# Patient Record
Sex: Female | Born: 1960 | Race: White | Hispanic: No | Marital: Married | Smoking: Never smoker
Health system: Southern US, Community
[De-identification: ages and names within clinical notes are randomized; demographics above are authoritative.]

## PROBLEM LIST (undated history)

## (undated) DIAGNOSIS — E7800 Pure hypercholesterolemia, unspecified: Secondary | ICD-10-CM

## (undated) DIAGNOSIS — E079 Disorder of thyroid, unspecified: Secondary | ICD-10-CM

## (undated) HISTORY — PX: ABDOMINAL HYSTERECTOMY: SHX81

---

## 1996-09-28 HISTORY — PX: REDUCTION MAMMAPLASTY: SUR839

## 2016-04-10 DIAGNOSIS — R12 Heartburn: Secondary | ICD-10-CM | POA: Insufficient documentation

## 2016-04-10 DIAGNOSIS — E782 Mixed hyperlipidemia: Secondary | ICD-10-CM | POA: Insufficient documentation

## 2016-04-10 DIAGNOSIS — Z8639 Personal history of other endocrine, nutritional and metabolic disease: Secondary | ICD-10-CM | POA: Insufficient documentation

## 2016-04-10 DIAGNOSIS — E890 Postprocedural hypothyroidism: Secondary | ICD-10-CM | POA: Insufficient documentation

## 2017-06-17 DIAGNOSIS — N951 Menopausal and female climacteric states: Secondary | ICD-10-CM | POA: Insufficient documentation

## 2018-06-30 DIAGNOSIS — E663 Overweight: Secondary | ICD-10-CM | POA: Insufficient documentation

## 2018-06-30 DIAGNOSIS — E782 Mixed hyperlipidemia: Secondary | ICD-10-CM | POA: Diagnosis not present

## 2018-06-30 DIAGNOSIS — R12 Heartburn: Secondary | ICD-10-CM | POA: Diagnosis not present

## 2018-06-30 DIAGNOSIS — E890 Postprocedural hypothyroidism: Secondary | ICD-10-CM | POA: Diagnosis not present

## 2018-07-07 ENCOUNTER — Other Ambulatory Visit: Payer: Self-pay | Admitting: Nurse Practitioner

## 2018-07-07 DIAGNOSIS — Z1231 Encounter for screening mammogram for malignant neoplasm of breast: Secondary | ICD-10-CM

## 2018-07-28 ENCOUNTER — Ambulatory Visit
Admission: RE | Admit: 2018-07-28 | Discharge: 2018-07-28 | Disposition: A | Discharge status: Home / Self Care | Payer: 59 | Source: Ambulatory Visit | Attending: Nurse Practitioner | Admitting: Nurse Practitioner

## 2018-07-28 DIAGNOSIS — Z1231 Encounter for screening mammogram for malignant neoplasm of breast: Secondary | ICD-10-CM | POA: Diagnosis not present

## 2018-10-23 ENCOUNTER — Ambulatory Visit (INDEPENDENT_AMBULATORY_CARE_PROVIDER_SITE_OTHER): Payer: 59

## 2018-10-23 ENCOUNTER — Ambulatory Visit
Admission: EM | Admit: 2018-10-23 | Discharge: 2018-10-23 | Disposition: A | Discharge status: Home / Self Care | Payer: 59 | Attending: Family Medicine | Admitting: Family Medicine

## 2018-10-23 ENCOUNTER — Other Ambulatory Visit: Payer: Self-pay

## 2018-10-23 DIAGNOSIS — R0781 Pleurodynia: Secondary | ICD-10-CM

## 2018-10-23 DIAGNOSIS — S299XXA Unspecified injury of thorax, initial encounter: Secondary | ICD-10-CM | POA: Diagnosis not present

## 2018-10-23 HISTORY — DX: Pure hypercholesterolemia, unspecified: E78.00

## 2018-10-23 HISTORY — DX: Disorder of thyroid, unspecified: E07.9

## 2018-10-23 MED ORDER — TRAMADOL HCL 50 MG PO TABS: 50.0000 mg | ORAL_TABLET | Freq: Three times a day (TID) | ORAL | 0 refills | Status: DC | PRN

## 2018-10-23 MED ORDER — MELOXICAM 15 MG PO TABS: 15.0000 mg | ORAL_TABLET | Freq: Every day | ORAL | 0 refills | Status: DC | PRN

## 2018-10-23 MED ORDER — TIZANIDINE HCL 4 MG PO CAPS: 4.0000 mg | ORAL_CAPSULE | Freq: Three times a day (TID) | ORAL | 0 refills | Status: DC | PRN

## 2018-10-23 NOTE — ED Provider Notes (Signed)
[facility]    CSN: [id number] Arrival date & time: 10/23/18  1211  History   Chief Complaint Chief Complaint  Patient presents with  . Muscle Pain  . Appointment   HPI  58 year old female presents with the above complaints.  Patient reports that last Monday she was at [facility].  She bent over and leaned down into a freezer/cooler.  In doing so she injured her right lower ribs.  She states that she continues to have pain.  Severe.  8/10 in severity.  Worse with certain movements.  Worse with deep breathing.  She has been taking ibuprofen without resolution.  It does help some.  No other associated symptoms.  No other complaints.  PMH, Surgical Hx, Family Hx, Social History reviewed and updated as below.  Past Medical History:  Diagnosis Date  . High cholesterol   . Thyroid disease   GERD  Past Surgical History:  Procedure Laterality Date  . ABDOMINAL HYSTERECTOMY    . REDUCTION MAMMAPLASTY Bilateral 1998   OB History   No obstetric history on file.    Home Medications    Prior to Admission medications   Medication Sig Start Date End Date Taking? Authorizing Provider  atorvastatin (LIPITOR) 20 MG tablet Take by mouth. 12/31/16  Yes [provider]  levothyroxine (SYNTHROID, LEVOTHROID) 100 MCG tablet TAKE 1 TABLET BY MOUTH DAILY AT 6 12/31/16  Yes [provider]  Calcium Carbonate-Vitamin D (CALCIUM HIGH POTENCY/VITAMIN D) 600-200 MG-UNIT TABS Take by mouth.    [provider]  meloxicam (MOBIC) 15 MG tablet Take 1 tablet (15 mg total) by mouth daily as needed. 10/23/18   Coral Spikes, DO  Multiple Vitamin (MULTIVITAMIN) tablet Take by mouth.    [provider]  tiZANidine (ZANAFLEX) 4 MG capsule Take 1 capsule (4 mg total) by mouth 3 (three) times daily as needed for muscle spasms. 10/23/18   Coral Spikes, DO  traMADol (ULTRAM) 50 MG tablet Take 1 tablet (50 mg total) by mouth every 8 (eight) hours as needed for moderate pain  or severe pain. 10/23/18   Coral Spikes, DO   Family History Family History  Problem Relation Age of Onset  . Breast cancer Sister 66       1/2 sister   Social History Social History   Tobacco Use  . Smoking status: Never Smoker  . Smokeless tobacco: Never Used  Substance Use Topics  . Alcohol use: Not Currently  . Drug use: Never   Allergies   Patient has no known allergies.  Review of Systems Review of Systems  Constitutional: Negative.   Musculoskeletal:       Right lower rib pain.   Physical Exam Triage Vital Signs ED Triage Vitals  Enc Vitals Group     BP 10/23/18 1223 139/80     Pulse Rate 10/23/18 1223 83     Resp 10/23/18 1223 16     Temp 10/23/18 1223 97.7 F (36.5 C)     Temp Source 10/23/18 1223 Oral     SpO2 10/23/18 1223 100 %     Weight 10/23/18 1225 173 lb (78.5 kg)     Height 10/23/18 1225 5\' 4"  (1.626 m)     Head Circumference --      Peak Flow --      Pain Score 10/23/18 1225 8     Pain Loc --      Pain Edu? --      Excl. in [place]? --  Updated Vital Signs BP 139/80 (BP Location: Left Arm)   Pulse 83   Temp 97.7 F (36.5 C) (Oral)   Resp 16   Ht 5\' 4"  (1.626 m)   Wt 78.5 kg   SpO2 100%   BMI 29.70 kg/m   Visual Acuity Right Eye Distance:   Left Eye Distance:   Bilateral Distance:    Right Eye Near:   Left Eye Near:    Bilateral Near:     Physical Exam Vitals signs and nursing note reviewed.  Constitutional:      General: She is not in acute distress. HENT:     Head: Normocephalic and atraumatic.  Eyes:     General:        Right eye: No discharge.        Left eye: No discharge.     Conjunctiva/sclera: Conjunctivae normal.  Cardiovascular:     Rate and Rhythm: Normal rate and regular rhythm.  Pulmonary:     Effort: Pulmonary effort is normal.     Breath sounds: Normal breath sounds.  Musculoskeletal:     Comments: Patient with a discrete area of tenderness on the right flank/right lower ribs.  No apparent bruising.    Neurological:     Mental Status: She is alert.  Psychiatric:        Mood and Affect: Mood normal.        Behavior: Behavior normal.    UC Treatments / Results  Labs (all labs ordered are listed, but only abnormal results are displayed) Labs Reviewed - No data to display  EKG None  Radiology Dg Ribs Unilateral W/chest Right  Result Date: 10/23/2018 CLINICAL DATA:  Lower right-sided rib pain after hitting right side 1 week ago. EXAM: RIGHT RIBS AND CHEST - 3+ VIEW COMPARISON:  None. FINDINGS: No fracture or other bone lesions are seen involving the ribs. There is no evidence of pneumothorax or pleural effusion. Both lungs are clear. Heart size and mediastinal contours are within normal limits. IMPRESSION: Negative. Electronically Signed   By: Kerby Moors M.D.   On: 10/23/2018 13:11    Procedures Procedures (including critical care time)  Medications Ordered in UC Medications - No data to display  Initial Impression / Assessment and Plan / UC Course  I have reviewed the triage vital signs and the nursing notes.  Pertinent labs & imaging results that were available during my care of the patient were reviewed by me and considered in my medical decision making (see chart for details).    58 year old female presents with rib pain/intercostal muscle pain/strain.  Treated with meloxicam, Zanaflex, tramadol.  Final Clinical Impressions(s) / UC Diagnoses   Final diagnoses:  Rib pain     Discharge Instructions     Rest.  Heat.  Medications as prescribed.  Take care  Dr. Lacinda Axon    ED Prescriptions    Medication Sig Dispense Auth. Provider   meloxicam (MOBIC) 15 MG tablet Take 1 tablet (15 mg total) by mouth daily as needed. 30 tablet Shanena Pellegrino G, DO   tiZANidine (ZANAFLEX) 4 MG capsule Take 1 capsule (4 mg total) by mouth 3 (three) times daily as needed for muscle spasms. 30 capsule Zalia Hautala G, DO   traMADol (ULTRAM) 50 MG tablet Take 1 tablet (50 mg total) by  mouth every 8 (eight) hours as needed for moderate pain or severe pain. 15 tablet Coral Spikes, DO     Controlled Substance Prescriptions [place] Controlled Substance Registry consulted? Not  Applicable   Coral Spikes, DO 10/23/18 1711

## 2018-10-23 NOTE — Discharge Instructions (Signed)
Rest.  Heat.  Medications as prescribed.  Take care  Dr. Janelli Welling  

## 2018-10-23 NOTE — ED Triage Notes (Signed)
Pt was leaning over a freezer last week and pulled something in her right side. Has been having right rib or muscle pain. Pain 8/10. Lying flat makes it better, and certain movements makes it worse. Worse with deep breath

## 2019-05-09 ENCOUNTER — Encounter: Payer: Self-pay | Admitting: Family Medicine

## 2019-05-09 ENCOUNTER — Other Ambulatory Visit: Payer: Self-pay

## 2019-05-09 ENCOUNTER — Ambulatory Visit: Payer: 59 | Admitting: Family Medicine

## 2019-05-09 VITALS — BP 118/70 | HR 92 | Temp 97.8°F | Resp 16 | Ht 63.5 in | Wt 171.0 lb

## 2019-05-09 DIAGNOSIS — E782 Mixed hyperlipidemia: Secondary | ICD-10-CM

## 2019-05-09 DIAGNOSIS — E663 Overweight: Secondary | ICD-10-CM | POA: Diagnosis not present

## 2019-05-09 DIAGNOSIS — E890 Postprocedural hypothyroidism: Secondary | ICD-10-CM

## 2019-05-09 DIAGNOSIS — Z8639 Personal history of other endocrine, nutritional and metabolic disease: Secondary | ICD-10-CM | POA: Diagnosis not present

## 2019-05-09 DIAGNOSIS — R12 Heartburn: Secondary | ICD-10-CM | POA: Diagnosis not present

## 2019-05-09 DIAGNOSIS — Z85820 Personal history of malignant melanoma of skin: Secondary | ICD-10-CM | POA: Diagnosis not present

## 2019-05-09 NOTE — Progress Notes (Signed)
Name: Caitlyn [name]   MRN: [id number]    DOB: 1961-05-07   Date:05/09/2019       Progress Note  Subjective  Chief Complaint  Chief Complaint  Patient presents with  . Establish Care  . Hypothyroidism  . 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  No hx thyroid cancer.  She has been on 159mcg for many years.  Denies constipation, diarrhea (takes a lot of fiber), no hair/skin/nail changes, no palpitations, heat/cold intolerance.   HLD: Has ben taking atorvastatin 20mg  for years, doing well with no chest pain, shortness of breath, or myalgias.   Overweight: Eats plenty of vegetables, tried to decrease carbohydrates, minimal fried foods/salt.  Grills her food a lot. Walks on treadmill 46min 5 days a week. She is down 9lbs since her last visit with prior PCP.   Heart Burn: Depends on her diet - has to avoid spicy/red sauce.  No difficulty swallowing, abdominal pain, blood in stool or dark and tarry stool.  Hx Melanoma and Vitamin D Def:  Going to dermatology annually; when she was diagnosed with melanoma she was told to stay out of the sun, so she started having lower vitamin D levels around that time.    HM: Hx total hysterectomy - has not been having paps done since then - about 10 years ago. Declines HIV screen; Hep C negative in 2017. Colonoscopy - due in 1 year - had one done in 2016  There are no active problems to display for this patient.   Past Surgical History:  Procedure Laterality Date  . ABDOMINAL HYSTERECTOMY    . REDUCTION MAMMAPLASTY Bilateral 1998    Family History  Problem Relation Age of Onset  . Breast cancer Sister 3       1/2 sister    Social History   Socioeconomic History  . Marital status: Married    Spouse name: Patrick Jupiter  . Number of children: 0  . Years of education: Not on file  . Highest  education level: Not on file  Occupational History  . Not on file  Social Needs  . Financial resource strain: Not hard at all  . Food insecurity    Worry: Never true    Inability: Never true  . Transportation needs    Medical: No    Non-medical: No  Tobacco Use  . Smoking status: Never Smoker  . Smokeless tobacco: Never Used  Substance and Sexual Activity  . Alcohol use: Not Currently  . Drug use: Never  . Sexual activity: Yes    Partners: Male  Lifestyle  . Physical activity    Days per week: 5 days    Minutes per session: 20 min  . Stress: Only a little  Relationships  . Social connections    Talks on phone: More than three times a week    Gets together: Never    Attends religious service: More than 4 times per year    Active member of club or organization: Yes    Attends meetings of clubs or organizations: More than 4 times per year    Relationship status: Married  . Intimate partner violence    Fear of current or ex partner: No    Emotionally abused: No    Physically abused: No    Forced sexual activity: No  Other Topics Concern  .  Not on file  Social History Narrative   [facility]     Current Outpatient Medications:  .  atorvastatin (LIPITOR) 20 MG tablet, Take by mouth., Disp: , Rfl:  .  Calcium Carbonate-Vitamin D (CALCIUM HIGH POTENCY/VITAMIN D) 600-200 MG-UNIT TABS, Take by mouth., Disp: , Rfl:  .  levothyroxine (SYNTHROID, LEVOTHROID) 100 MCG tablet, TAKE 1 TABLET BY MOUTH DAILY AT 6, Disp: , Rfl:  .  Multiple Vitamin (MULTIVITAMIN) tablet, Take by mouth., Disp: , Rfl:  .  meloxicam (MOBIC) 15 MG tablet, Take 1 tablet (15 mg total) by mouth daily as needed. (Patient not taking: Reported on 05/09/2019), Disp: 30 tablet, Rfl: 0 .  tiZANidine (ZANAFLEX) 4 MG capsule, Take 1 capsule (4 mg total) by mouth 3 (three) times daily as needed for muscle spasms. (Patient not taking: Reported on 05/09/2019), Disp: 30 capsule, Rfl: 0 .  traMADol  (ULTRAM) 50 MG tablet, Take 1 tablet (50 mg total) by mouth every 8 (eight) hours as needed for moderate pain or severe pain. (Patient not taking: Reported on 05/09/2019), Disp: 15 tablet, Rfl: 0  No Known Allergies  I personally reviewed active problem list, medication list, allergies, notes from last encounter, lab results with the patient/caregiver today.   ROS  Constitutional: Negative for fever or weight change.  Respiratory: Negative for cough and shortness of breath.   Cardiovascular: Negative for chest pain or palpitations.  Gastrointestinal: Negative for abdominal pain, no bowel changes.  Musculoskeletal: Negative for gait problem or joint swelling.  Skin: Negative for rash.  Neurological: Negative for dizziness or headache.  No other specific complaints in a complete review of systems (except as listed in HPI above).  Objective  Vitals:   05/09/19 1055  BP: 118/70  Pulse: 92  Resp: 16  Temp: 97.8 F (36.6 C)  TempSrc: Oral  SpO2: 99%  Weight: 171 lb (77.6 kg)  Height: 5' 3.5" (1.613 m)    Body mass index is 29.82 kg/m.  Physical Exam  Constitutional: Patient appears well-developed and well-nourished. No distress.  HENT: Head: Normocephalic and atraumatic. Eyes: Conjunctivae and EOM are normal. No scleral icterus.  Neck: Normal range of motion. Neck supple. No JVD present. No thyromegaly present.  Cardiovascular: Normal rate, regular rhythm and normal heart sounds.  No murmur heard. No BLE edema. Pulmonary/Chest: Effort normal and breath sounds normal. No respiratory distress. Musculoskeletal: Normal range of motion, no joint effusions. No gross deformities Neurological: Pt is alert and oriented to person, place, and time. No cranial nerve deficit. Coordination, balance, strength, speech and gait are normal.  Skin: Skin is warm and dry. No rash noted. No erythema.  Psychiatric: Patient has a normal mood and affect. behavior is normal. Judgment and thought  content normal.  No results found for this or any previous visit (from the past 72 hour(s)).   PHQ2/9: Depression screen PHQ 2/9 05/09/2019  Decreased Interest 0  Down, Depressed, Hopeless 0  PHQ - 2 Score 0  Altered sleeping 0  Tired, decreased energy 0  Change in appetite 0  Feeling bad or failure about yourself  0  Trouble concentrating 0  Moving slowly or fidgety/restless 0  Suicidal thoughts 0  PHQ-9 Score 0  Difficult doing work/chores Not difficult at all   PHQ-2/9 Result is negative.    Fall Risk: Fall Risk  05/09/2019  Falls in the past year? 0  Number falls in past yr: 0  Injury with Fall? 0  Follow up Falls evaluation completed  Assessment & Plan  1. Postoperative hypothyroidism - Continue with current regimen - TSH  2. Mixed hyperlipidemia - Continue statin therapy - Lipid panel  3. Overweight (BMI 25.0-29.9) - Discussed importance of 150 minutes of physical activity weekly, eat two servings of fish weekly, eat one serving of tree nuts ( cashews, pistachios, pecans, almonds.Marland Kitchen) every other day, eat 6 servings of fruit/vegetables daily and drink plenty of water and avoid sweet beverages.  - Comprehensive metabolic panel  4. Heartburn - Avoid triggers - Comprehensive metabolic panel  5. History of vitamin D deficiency - VITAMIN D 25 Hydroxy (Vit-D Deficiency, Fractures)  6. History of melanoma - Follow up with dermatology annually.

## 2019-05-10 ENCOUNTER — Other Ambulatory Visit: Payer: Self-pay | Admitting: Family Medicine

## 2019-05-10 DIAGNOSIS — E875 Hyperkalemia: Secondary | ICD-10-CM

## 2019-05-10 DIAGNOSIS — E890 Postprocedural hypothyroidism: Secondary | ICD-10-CM

## 2019-05-10 LAB — LIPID PANEL
Cholesterol: 150 mg/dL (ref <200)
HDL: 47 mg/dL — ABNORMAL LOW (ref >=50)
LDL Cholesterol (Calc): 78 mg/dL (calc)
Non-HDL Cholesterol (Calc): 103 mg/dL (calc) (ref <130)
Total CHOL/HDL Ratio: 3.2 (calc) (ref <5.0)
Triglycerides: 148 mg/dL (ref <150)

## 2019-05-10 LAB — COMPREHENSIVE METABOLIC PANEL
AG Ratio: 1.7 (calc) (ref 1.0–2.5)
ALT: 25 U/L (ref 6–29)
AST: 22 U/L (ref 10–35)
Albumin: 4.7 g/dL (ref 3.6–5.1)
Alkaline phosphatase (APISO): 58 U/L (ref 37–153)
BUN: 9 mg/dL (ref 7–25)
CO2: 28 mmol/L (ref 20–32)
Calcium: 9.7 mg/dL (ref 8.6–10.4)
Chloride: 104 mmol/L (ref 98–110)
Creat: 0.7 mg/dL (ref 0.50–1.05)
Globulin: 2.7 g/dL (calc) (ref 1.9–3.7)
Glucose, Bld: 94 mg/dL (ref 65–99)
Potassium: 5.4 mmol/L — ABNORMAL HIGH (ref 3.5–5.3)
Sodium: 141 mmol/L (ref 135–146)
Total Bilirubin: 0.7 mg/dL (ref 0.2–1.2)
Total Protein: 7.4 g/dL (ref 6.1–8.1)

## 2019-05-10 LAB — TSH: TSH: 0.31 mIU/L — ABNORMAL LOW

## 2019-05-10 LAB — VITAMIN D 25 HYDROXY (VIT D DEFICIENCY, FRACTURES): Vit D, 25-Hydroxy: 38 ng/mL (ref 30–100)

## 2019-06-14 ENCOUNTER — Other Ambulatory Visit: Payer: Self-pay | Admitting: Family Medicine

## 2019-06-14 DIAGNOSIS — Z1231 Encounter for screening mammogram for malignant neoplasm of breast: Secondary | ICD-10-CM

## 2019-06-20 ENCOUNTER — Telehealth: Payer: Self-pay | Admitting: Family Medicine

## 2019-06-20 NOTE — Telephone Encounter (Signed)
-----   Message from Hubbard Hartshorn, [place] sent at 05/10/2019  1:23 PM EDT ----- Regarding: Call patient to come in for repeat labs Call patient to come in for repeat labs

## 2019-06-23 DIAGNOSIS — D2262 Melanocytic nevi of left upper limb, including shoulder: Secondary | ICD-10-CM | POA: Diagnosis not present

## 2019-06-23 DIAGNOSIS — D225 Melanocytic nevi of trunk: Secondary | ICD-10-CM | POA: Diagnosis not present

## 2019-06-23 DIAGNOSIS — L570 Actinic keratosis: Secondary | ICD-10-CM | POA: Diagnosis not present

## 2019-06-23 DIAGNOSIS — X32XXXA Exposure to sunlight, initial encounter: Secondary | ICD-10-CM | POA: Diagnosis not present

## 2019-06-23 DIAGNOSIS — Z85820 Personal history of malignant melanoma of skin: Secondary | ICD-10-CM | POA: Diagnosis not present

## 2019-06-23 DIAGNOSIS — L821 Other seborrheic keratosis: Secondary | ICD-10-CM | POA: Diagnosis not present

## 2019-06-23 DIAGNOSIS — D485 Neoplasm of uncertain behavior of skin: Secondary | ICD-10-CM | POA: Diagnosis not present

## 2019-06-23 DIAGNOSIS — D2261 Melanocytic nevi of right upper limb, including shoulder: Secondary | ICD-10-CM | POA: Diagnosis not present

## 2019-06-23 DIAGNOSIS — Z08 Encounter for follow-up examination after completed treatment for malignant neoplasm: Secondary | ICD-10-CM | POA: Diagnosis not present

## 2019-06-23 DIAGNOSIS — D2271 Melanocytic nevi of right lower limb, including hip: Secondary | ICD-10-CM | POA: Diagnosis not present

## 2019-06-23 DIAGNOSIS — D2272 Melanocytic nevi of left lower limb, including hip: Secondary | ICD-10-CM | POA: Diagnosis not present

## 2019-06-27 ENCOUNTER — Other Ambulatory Visit: Payer: Self-pay

## 2019-06-27 DIAGNOSIS — E875 Hyperkalemia: Secondary | ICD-10-CM

## 2019-06-27 DIAGNOSIS — E890 Postprocedural hypothyroidism: Secondary | ICD-10-CM

## 2019-06-27 NOTE — Telephone Encounter (Signed)
Patient states she did not realize she had to come back for labs. She thought someone told her they were normal. Caitlyn [name] states she does not take any Potassium supplements nor eat a diet rich in Potassium foods. I forwarded her labs results on Mychart message and asked her to come back to recheck her levels. Also she states she has been maintaining taking her Levothyroxine 100 mcg daily.

## 2019-06-28 NOTE — Telephone Encounter (Signed)
Documentation reviewed 

## 2019-07-05 DIAGNOSIS — E890 Postprocedural hypothyroidism: Secondary | ICD-10-CM | POA: Diagnosis not present

## 2019-07-05 DIAGNOSIS — E875 Hyperkalemia: Secondary | ICD-10-CM | POA: Diagnosis not present

## 2019-07-06 LAB — BASIC METABOLIC PANEL WITH GFR
BUN: 11 mg/dL (ref 7–25)
CO2: 32 mmol/L (ref 20–32)
Calcium: 9.6 mg/dL (ref 8.6–10.4)
Chloride: 103 mmol/L (ref 98–110)
Creat: 0.72 mg/dL (ref 0.50–1.05)
GFR, Est African American: 108 mL/min/{1.73_m2} (ref >=60)
GFR, Est Non African American: 94 mL/min/{1.73_m2} (ref >=60)
Glucose, Bld: 92 mg/dL (ref 65–99)
Potassium: 4.8 mmol/L (ref 3.5–5.3)
Sodium: 140 mmol/L (ref 135–146)

## 2019-07-06 LAB — TSH: TSH: 0.29 mIU/L — ABNORMAL LOW (ref 0.40–4.50)

## 2019-07-13 ENCOUNTER — Telehealth: Payer: Self-pay | Admitting: Family Medicine

## 2019-07-13 ENCOUNTER — Other Ambulatory Visit: Payer: Self-pay | Admitting: Emergency Medicine

## 2019-07-13 DIAGNOSIS — E890 Postprocedural hypothyroidism: Secondary | ICD-10-CM

## 2019-07-13 MED ORDER — LEVOTHYROXINE SODIUM 100 MCG PO TABS: ORAL_TABLET | ORAL | 0 refills | Status: DC

## 2019-07-13 NOTE — Telephone Encounter (Signed)
Copied from [place] [id number]. Topic: Quick Communication - Rx Refill/Question >> Jul 13, 2019 10:19 AM Rainey Pines A wrote: Medication: levothyroxine (SYNTHROID, LEVOTHROID) 100 MCG tablet ,atorvastatin (LIPITOR) 20 MG tablet  (Patient would like callback from nurse once medication has been sent to pharmacy.  Has the patient contacted their pharmacy? {Yes (Agent: If no, request that the patient contact the pharmacy for the refill.) (Agent: If yes, when and what did the pharmacy advise?)Contact PCP  Preferred Pharmacy (with phone number or street name): [facility], [place] [phone] (Phone) [phone] (Fax)    Agent: Please be advised that RX refills may take up to 3 business days. We ask that you follow-up with your pharmacy.

## 2019-07-13 NOTE — Telephone Encounter (Signed)
Order sent to [facility] for refill 

## 2019-07-17 ENCOUNTER — Encounter: Payer: Self-pay | Admitting: Family Medicine

## 2019-07-17 DIAGNOSIS — E890 Postprocedural hypothyroidism: Secondary | ICD-10-CM

## 2019-07-17 MED ORDER — ATORVASTATIN CALCIUM 20 MG PO TABS: 20.0000 mg | ORAL_TABLET | Freq: Every day | ORAL | 1 refills | Status: DC

## 2019-07-17 MED ORDER — LEVOTHYROXINE SODIUM 100 MCG PO TABS: ORAL_TABLET | ORAL | 0 refills | Status: DC

## 2019-07-18 NOTE — Telephone Encounter (Signed)
Pt states rxs were sent to the wrong pharmacy.  Please re send rx to:   [name], Caitlyn [phone] (Phone) [phone] (Fax)   Pt also states that when she called [facility], they did not have rx for her.

## 2019-07-19 ENCOUNTER — Other Ambulatory Visit: Payer: Self-pay | Admitting: Emergency Medicine

## 2019-07-19 DIAGNOSIS — E890 Postprocedural hypothyroidism: Secondary | ICD-10-CM

## 2019-07-19 MED ORDER — ATORVASTATIN CALCIUM 20 MG PO TABS: 20.0000 mg | ORAL_TABLET | Freq: Every day | ORAL | 3 refills | Status: AC

## 2019-07-19 MED ORDER — LEVOTHYROXINE SODIUM 100 MCG PO TABS: ORAL_TABLET | ORAL | 0 refills | Status: DC

## 2019-07-19 NOTE — Addendum Note (Signed)
Addended by: Hubbard Hartshorn on: 07/19/2019 11:35 AM   Modules accepted: Orders

## 2019-07-19 NOTE — Telephone Encounter (Signed)
Please send again, Called to cancel at [place]

## 2019-07-19 NOTE — Telephone Encounter (Signed)
Rx's sent to [facility].

## 2019-07-19 NOTE — Telephone Encounter (Signed)
Script pend in system, pharmacy changed to [facility]

## 2019-07-31 ENCOUNTER — Ambulatory Visit
Admission: RE | Admit: 2019-07-31 | Discharge: 2019-07-31 | Disposition: A | Discharge status: Home / Self Care | Payer: 59 | Source: Ambulatory Visit | Attending: Family Medicine | Admitting: Family Medicine

## 2019-07-31 DIAGNOSIS — Z1231 Encounter for screening mammogram for malignant neoplasm of breast: Secondary | ICD-10-CM | POA: Insufficient documentation

## 2019-08-16 ENCOUNTER — Telehealth: Payer: Self-pay | Admitting: Family Medicine

## 2019-08-16 NOTE — Telephone Encounter (Signed)
Documentation reviewed 

## 2019-08-16 NOTE — Telephone Encounter (Signed)
Patient notified. Mom tested positive for Covid at 40 and she has been in a panic. Has not remembered to take medication in past week. Will come by in about 3 weeks to do labs

## 2019-08-16 NOTE — Telephone Encounter (Signed)
-----   Message from Hubbard Hartshorn, FNP sent at 07/13/2019  5:59 PM EDT ----- Regarding: Come in for TSH recheck Please call the patient to remind her to come in for TSH recheck.

## 2019-08-17 ENCOUNTER — Telehealth: Payer: Self-pay | Admitting: Family Medicine

## 2019-08-17 NOTE — Telephone Encounter (Signed)
Patient stated she had a lot going on and will come for labs and appointment in about 3 weeks

## 2019-08-17 NOTE — Telephone Encounter (Signed)
-----   Message from Hubbard Hartshorn, [place] sent at 07/06/2019  8:05 AM EDT ----- Regarding: TSH recheck Needs to come in for physical and TSH recheck if not already done.

## 2019-09-11 ENCOUNTER — Encounter: Payer: Self-pay | Admitting: Family Medicine

## 2019-09-12 DIAGNOSIS — E890 Postprocedural hypothyroidism: Secondary | ICD-10-CM | POA: Diagnosis not present

## 2019-09-12 LAB — TSH: TSH: 1.24 mIU/L (ref 0.40–4.50)

## 2019-10-13 DIAGNOSIS — H31012 Macula scars of posterior pole (postinflammatory) (post-traumatic), left eye: Secondary | ICD-10-CM | POA: Diagnosis not present

## 2019-10-13 DIAGNOSIS — H04123 Dry eye syndrome of bilateral lacrimal glands: Secondary | ICD-10-CM | POA: Diagnosis not present

## 2019-10-13 DIAGNOSIS — H35362 Drusen (degenerative) of macula, left eye: Secondary | ICD-10-CM | POA: Diagnosis not present

## 2019-10-13 DIAGNOSIS — H5213 Myopia, bilateral: Secondary | ICD-10-CM | POA: Diagnosis not present

## 2019-10-16 ENCOUNTER — Other Ambulatory Visit: Payer: Self-pay | Admitting: Family Medicine

## 2019-10-16 DIAGNOSIS — E890 Postprocedural hypothyroidism: Secondary | ICD-10-CM

## 2019-11-14 DIAGNOSIS — L820 Inflamed seborrheic keratosis: Secondary | ICD-10-CM | POA: Diagnosis not present

## 2019-11-14 DIAGNOSIS — L538 Other specified erythematous conditions: Secondary | ICD-10-CM | POA: Diagnosis not present

## 2019-11-14 DIAGNOSIS — R208 Other disturbances of skin sensation: Secondary | ICD-10-CM | POA: Diagnosis not present

## 2020-03-27 ENCOUNTER — Telehealth: Payer: Self-pay

## 2020-03-27 NOTE — Telephone Encounter (Signed)
Pt needs to schedule an appt for refills

## 2020-03-28 NOTE — Telephone Encounter (Signed)
Pt said she is currently out of town and does not know when she will be back. she will have to go to a urgent care because she did not know she had no refills

## 2020-04-08 DIAGNOSIS — R002 Palpitations: Secondary | ICD-10-CM | POA: Diagnosis not present

## 2020-04-08 DIAGNOSIS — E7800 Pure hypercholesterolemia, unspecified: Secondary | ICD-10-CM | POA: Diagnosis not present

## 2020-04-08 DIAGNOSIS — E039 Hypothyroidism, unspecified: Secondary | ICD-10-CM | POA: Diagnosis not present

## 2020-04-17 DIAGNOSIS — E039 Hypothyroidism, unspecified: Secondary | ICD-10-CM | POA: Diagnosis not present

## 2020-04-17 DIAGNOSIS — R1013 Epigastric pain: Secondary | ICD-10-CM | POA: Diagnosis not present

## 2020-04-17 DIAGNOSIS — K2970 Gastritis, unspecified, without bleeding: Secondary | ICD-10-CM | POA: Diagnosis not present

## 2020-04-17 DIAGNOSIS — R002 Palpitations: Secondary | ICD-10-CM | POA: Diagnosis not present

## 2020-04-17 DIAGNOSIS — R079 Chest pain, unspecified: Secondary | ICD-10-CM | POA: Diagnosis not present

## 2020-04-17 DIAGNOSIS — E785 Hyperlipidemia, unspecified: Secondary | ICD-10-CM | POA: Diagnosis not present

## 2020-04-30 DIAGNOSIS — R002 Palpitations: Secondary | ICD-10-CM | POA: Diagnosis not present

## 2020-04-30 DIAGNOSIS — Z09 Encounter for follow-up examination after completed treatment for conditions other than malignant neoplasm: Secondary | ICD-10-CM | POA: Diagnosis not present

## 2020-04-30 DIAGNOSIS — R710 Precipitous drop in hematocrit: Secondary | ICD-10-CM | POA: Diagnosis not present

## 2020-05-17 DIAGNOSIS — R002 Palpitations: Secondary | ICD-10-CM | POA: Diagnosis not present

## 2020-05-19 DIAGNOSIS — R002 Palpitations: Secondary | ICD-10-CM | POA: Diagnosis not present

## 2020-05-21 DIAGNOSIS — E039 Hypothyroidism, unspecified: Secondary | ICD-10-CM | POA: Diagnosis not present

## 2020-07-11 DIAGNOSIS — Z23 Encounter for immunization: Secondary | ICD-10-CM | POA: Diagnosis not present

## 2020-07-17 DIAGNOSIS — Z1231 Encounter for screening mammogram for malignant neoplasm of breast: Secondary | ICD-10-CM | POA: Diagnosis not present

## 2020-07-22 DIAGNOSIS — D485 Neoplasm of uncertain behavior of skin: Secondary | ICD-10-CM | POA: Diagnosis not present

## 2020-07-22 DIAGNOSIS — D2261 Melanocytic nevi of right upper limb, including shoulder: Secondary | ICD-10-CM | POA: Diagnosis not present

## 2020-07-22 DIAGNOSIS — D2271 Melanocytic nevi of right lower limb, including hip: Secondary | ICD-10-CM | POA: Diagnosis not present

## 2020-07-22 DIAGNOSIS — L821 Other seborrheic keratosis: Secondary | ICD-10-CM | POA: Diagnosis not present

## 2020-07-22 DIAGNOSIS — L538 Other specified erythematous conditions: Secondary | ICD-10-CM | POA: Diagnosis not present

## 2020-07-22 DIAGNOSIS — L570 Actinic keratosis: Secondary | ICD-10-CM | POA: Diagnosis not present

## 2020-07-22 DIAGNOSIS — D225 Melanocytic nevi of trunk: Secondary | ICD-10-CM | POA: Diagnosis not present

## 2020-07-22 DIAGNOSIS — D2262 Melanocytic nevi of left upper limb, including shoulder: Secondary | ICD-10-CM | POA: Diagnosis not present

## 2020-07-22 DIAGNOSIS — Z85820 Personal history of malignant melanoma of skin: Secondary | ICD-10-CM | POA: Diagnosis not present

## 2020-07-22 DIAGNOSIS — C44619 Basal cell carcinoma of skin of left upper limb, including shoulder: Secondary | ICD-10-CM | POA: Diagnosis not present

## 2020-07-22 DIAGNOSIS — X32XXXA Exposure to sunlight, initial encounter: Secondary | ICD-10-CM | POA: Diagnosis not present

## 2020-07-22 DIAGNOSIS — B078 Other viral warts: Secondary | ICD-10-CM | POA: Diagnosis not present

## 2020-08-12 DIAGNOSIS — C44619 Basal cell carcinoma of skin of left upper limb, including shoulder: Secondary | ICD-10-CM | POA: Diagnosis not present

## 2020-09-30 DIAGNOSIS — M25552 Pain in left hip: Secondary | ICD-10-CM | POA: Diagnosis not present

## 2020-09-30 DIAGNOSIS — Z9181 History of falling: Secondary | ICD-10-CM | POA: Diagnosis not present

## 2020-09-30 DIAGNOSIS — M545 Low back pain, unspecified: Secondary | ICD-10-CM | POA: Diagnosis not present

## 2021-10-25 ENCOUNTER — Emergency Department (HOSPITAL_COMMUNITY)
Admission: EM | Admit: 2021-10-25 | Discharge: 2021-10-25 | Disposition: A | Discharge status: Home / Self Care | Payer: 59 | Attending: Emergency Medicine | Admitting: Emergency Medicine

## 2021-10-25 ENCOUNTER — Other Ambulatory Visit: Payer: Self-pay

## 2021-10-25 ENCOUNTER — Emergency Department (HOSPITAL_COMMUNITY): Payer: 59

## 2021-10-25 DIAGNOSIS — Y92838 Other recreation area as the place of occurrence of the external cause: Secondary | ICD-10-CM | POA: Diagnosis not present

## 2021-10-25 DIAGNOSIS — R7309 Other abnormal glucose: Secondary | ICD-10-CM | POA: Insufficient documentation

## 2021-10-25 DIAGNOSIS — X58XXXA Exposure to other specified factors, initial encounter: Secondary | ICD-10-CM | POA: Diagnosis not present

## 2021-10-25 DIAGNOSIS — R0789 Other chest pain: Secondary | ICD-10-CM | POA: Diagnosis present

## 2021-10-25 DIAGNOSIS — S2232XA Fracture of one rib, left side, initial encounter for closed fracture: Secondary | ICD-10-CM | POA: Diagnosis not present

## 2021-10-25 LAB — CBC WITH DIFFERENTIAL/PLATELET
Abs Immature Granulocytes: 0.04 10*3/uL (ref 0.00–0.07)
Basophils Absolute: 0.1 10*3/uL (ref 0.0–0.1)
Basophils Relative: 1 %
Eosinophils Absolute: 0.1 10*3/uL (ref 0.0–0.5)
Eosinophils Relative: 1 %
HCT: 36.1 % (ref 36.0–46.0)
Hemoglobin: 12.2 g/dL (ref 12.0–15.0)
Immature Granulocytes: 1 %
Lymphocytes Relative: 19 %
Lymphs Abs: 1.6 10*3/uL (ref 0.7–4.0)
MCH: 30.1 pg (ref 26.0–34.0)
MCHC: 33.8 g/dL (ref 30.0–36.0)
MCV: 89.1 fL (ref 80.0–100.0)
Monocytes Absolute: 0.4 10*3/uL (ref 0.1–1.0)
Monocytes Relative: 5 %
Neutro Abs: 6.3 10*3/uL (ref 1.7–7.7)
Neutrophils Relative %: 73 %
Platelets: 287 10*3/uL (ref 150–400)
RBC: 4.05 MIL/uL (ref 3.87–5.11)
RDW: 13.2 % (ref 11.5–15.5)
WBC: 8.5 10*3/uL (ref 4.0–10.5)
nRBC: 0 % (ref 0.0–0.2)

## 2021-10-25 LAB — BASIC METABOLIC PANEL
Anion gap: 9 (ref 5–15)
BUN: 12 mg/dL (ref 6–20)
CO2: 26 mmol/L (ref 22–32)
Calcium: 8.9 mg/dL (ref 8.9–10.3)
Chloride: 99 mmol/L (ref 98–111)
Creatinine, Ser: 0.53 mg/dL (ref 0.44–1.00)
GFR, Estimated: >60 mL/min (ref >60)
Glucose, Bld: 102 mg/dL — ABNORMAL HIGH (ref 70–99)
Potassium: 3.6 mmol/L (ref 3.5–5.1)
Sodium: 134 mmol/L — ABNORMAL LOW (ref 135–145)

## 2021-10-25 LAB — CBG MONITORING, ED: Glucose-Capillary: 82 mg/dL (ref 70–99)

## 2021-10-25 MED ORDER — ACETAMINOPHEN 500 MG PO TABS
1000.0000 mg | ORAL_TABLET | Freq: Four times a day (QID) | ORAL | Status: DC | PRN
  Administered 2021-10-25 (×2): 1000 mg via ORAL
  Filled 2021-10-25 (×3): qty 2

## 2021-10-25 MED ORDER — OXYCODONE-ACETAMINOPHEN 5-325 MG PO TABS
1.0000 | ORAL_TABLET | Freq: Once | ORAL | Status: AC
  Administered 2021-10-25: 1 via ORAL
  Filled 2021-10-25: qty 1

## 2021-10-25 MED ORDER — OXYCODONE-ACETAMINOPHEN 5-325 MG PO TABS: 1.0000 | ORAL_TABLET | Freq: Four times a day (QID) | ORAL | 0 refills | Status: DC | PRN

## 2021-10-25 MED ORDER — LIDOCAINE 5 % EX PTCH
1.0000 | MEDICATED_PATCH | CUTANEOUS | Status: DC
  Administered 2021-10-25: 1 via TRANSDERMAL
  Filled 2021-10-25: qty 1

## 2021-10-25 NOTE — ED Triage Notes (Addendum)
Patient reports sharp left side pain today after bending over, stood up, felt nauseated, passed out. Pain rated 8/10. Patient reports shobr also. O2 sat in triage 99%.

## 2021-10-25 NOTE — ED Notes (Signed)
Patient transported to X-ray 

## 2021-10-25 NOTE — Discharge Instructions (Addendum)
You were diagnosed with a fracture of your eighth rib on the left side.  Unfortunately, there is not much that we can do with the rib fractures aside from managing your pain and preventing lung infections.  You were given an incentive spirometry which you should do 3-4 times a day to help prevent lung infection.  I sent you home a few Percocet to help with pain as needed, otherwise you can use Tylenol Motrin.  Remember to ice the area as often as you can for approximately 15 to 20 minutes at a time.  I have given you a lidocaine patch over the area today.  If you feel like it helps you, you can pick some up at the drugstore over-the-counter, this is called Salonpas.  Please follow-up with your PCP as needed.  Also follow-up with her if you develop difficulty breathing or cough.

## 2021-10-25 NOTE — ED Provider Triage Note (Signed)
Emergency Medicine Provider Triage Evaluation Note  Caitlyn [name] , a 61 y.o. female  was evaluated in triage.  Pt complains of sudden onset left rib pain.  Patient states [place] granddaughters swim [place], when she bent over to pick something up she felt a sudden pain of her left ribs.  She does not know exactly how she injured it and thinks maybe she hit the bleachers.  She is having trouble moving, bending and taking deep breaths due to pain.  No treatment prior to arrival.  No alleviating factors.  Review of Systems  Positive: Left rib pain Negative: Chest pain, nausea, vomiting, diarrhea, fevers  Physical Exam  BP 132/69 (BP Location: Left Arm)    Pulse 89    Temp 98.3 F (36.8 C) (Oral)    Resp 18    Ht 5\' 3"  (1.6 m)    Wt 74.8 kg    SpO2 100%    BMI 29.23 kg/m  Gen:   Awake, no distress   Resp:  Shallow breaths secondary to pain MSK:   Moves extremities without difficulty  Other:  Focal tenderness of the anterior lateral lower rib at around the level of the ninth or 10th rib.  No palpable deformities.  Medical Decision Making  Medically screening exam initiated at 2:16 PM.  Appropriate orders placed.  Caitlyn [name] was informed that the remainder of the evaluation will be completed by another provider, this initial triage assessment does not replace that evaluation, and the importance of remaining in the ED until their evaluation is complete.     Tonye Pearson, [place] 10/25/21 1418

## 2021-10-25 NOTE — ED Provider Notes (Signed)
[facility] DEPT Provider Note   CSN: [id number] Arrival date & time: 10/25/21  1334     History  Chief Complaint  Patient presents with   left side pain    Caitlyn [name] is a 61 y.o. female complains of sudden onset left rib pain.  Patient states that [place] the aquatic [place] for her granddaughters swim meet, when she bent over to pick something up she felt a sudden pain of her left ribs.  She does not know exactly how she injured it and thinks maybe she hit the bleachers.  She is having trouble moving, bending and taking deep breaths due to pain.  No treatment prior to arrival.  No alleviating factors.  HPI     Home Medications Prior to Admission medications   Medication Sig Start Date End Date Taking? Authorizing Provider  atorvastatin (LIPITOR) 20 MG tablet Take 1 tablet (20 mg total) by mouth daily. 07/19/19   Hubbard Hartshorn, FNP  Calcium Carbonate-Vitamin D (CALCIUM HIGH POTENCY/VITAMIN D) 600-200 MG-UNIT TABS Take by mouth.    [provider]  levothyroxine (SYNTHROID) 100 MCG tablet TAKE 1/2 TABLET ONE DAY A WEEK, AND 1 TABLET ONCE DAILY ON THE OTHER DAYS. 10/17/19   Hubbard Hartshorn, FNP  Multiple Vitamin (MULTIVITAMIN) tablet Take by mouth.    [provider]      Allergies    Patient has no known allergies.    Review of Systems   Review of Systems  Constitutional:  Negative for fever.  HENT: Negative.    Eyes: Negative.   Respiratory:  Negative for shortness of breath.   Cardiovascular: Negative.   Gastrointestinal:  Negative for abdominal pain and vomiting.  Endocrine: Negative.   Genitourinary: Negative.   Musculoskeletal: Negative.   Skin:  Negative for rash.  Neurological:  Negative for headaches.  All other systems reviewed and are negative.  Physical Exam Updated Vital Signs BP 130/75 (BP Location: Right Arm)    Pulse 86    Temp 98.3 F (36.8 C) (Oral)    Resp 16    Ht 5\' 3"  (1.6 m)    Wt 74.8 kg     SpO2 100%    BMI 29.23 kg/m  Physical Exam Vitals and nursing note reviewed.  Constitutional:      General: She is not in acute distress.    Appearance: She is not ill-appearing.  HENT:     Head: Atraumatic.  Eyes:     Conjunctiva/sclera: Conjunctivae normal.  Cardiovascular:     Rate and Rhythm: Normal rate and regular rhythm.     Pulses: Normal pulses.     Heart sounds: No murmur heard. Pulmonary:     Effort: Pulmonary effort is normal. No respiratory distress.     Breath sounds: Normal breath sounds.  Chest:       Comments: Focal tenderness at the level of the eighth or ninth rib.  No palpable deformities, erythema, induration Abdominal:     General: Abdomen is flat. There is no distension.     Palpations: Abdomen is soft.     Tenderness: There is no abdominal tenderness.  Musculoskeletal:        General: Normal range of motion.     Cervical back: Normal range of motion.  Skin:    General: Skin is warm and dry.     Capillary Refill: Capillary refill takes less than 2 seconds.  Neurological:     General: No focal deficit present.  Mental Status: She is alert.  Psychiatric:        Mood and Affect: Mood normal.    ED Results / Procedures / Treatments   Labs (all labs ordered are listed, but only abnormal results are displayed) Labs Reviewed  BASIC METABOLIC PANEL - Abnormal; Notable for the following components:      Result Value   Sodium 134 (*)    Glucose, Bld 102 (*)    All other components within normal limits  CBC WITH DIFFERENTIAL/PLATELET  CBG MONITORING, ED    EKG None  Radiology No results found.  Procedures Procedures    Medications Ordered in ED Medications  acetaminophen (TYLENOL) tablet 1,000 mg (1,000 mg Oral Given 10/25/21 1407)  oxyCODONE-acetaminophen (PERCOCET/ROXICET) 5-325 MG per tablet 1 tablet (has no administration in time range)    ED Course/ Medical Decision Making/ A&P                           Medical Decision  Making Amount and/or Complexity of Data Reviewed Labs: ordered. Radiology: ordered.  Risk OTC drugs. Prescription drug management.   History:  Caitlyn [name] is a 61 y.o. female complains of sudden onset left rib pain.  Patient states that she was at the aquatic center for her granddaughters swim meet, when she bent over to pick something up she felt a sudden pain of her left ribs.  She does not know exactly how she injured it and thinks maybe she hit the bleachers.  She is having trouble moving, bending and taking deep breaths due to pain.  No treatment prior to arrival.  No alleviating factors. Additional history obtained from husband This patient presents to the ED for concern of rib pain, this involves an extensive number of treatment options, and is a complaint that carries with it a high risk of complications and morbidity.   Complications of this complaint include pneumothorax, pneumonia, pleural effusion  Initial impression:  Patient appears uncomfortable although nontoxic.  She states she had moderate improvement with pain after 1 g Tylenol given to her in triage.  Tenderness at the level of the eighth and ninth rib.  Rib x-ray available at time of evaluation confirms nondisplaced rib fracture of the eighth left rib.  BMP, CBC and blood glucose all normal.    Lab Tests and EKG:  I Ordered, reviewed, and interpreted labs and EKG.  The pertinent results in my decision-making regarding them are detailed in the ED course and/or initial impression section above.   Imaging Studies ordered:  I ordered imaging studies including rib x-ray which shows fracture of the eighth left rib I independently visualized and interpreted imaging and I agree with the radiologist interpretation. Decisions made regarding results are detailed in the ED course and/or initial impression section above.   Medicines ordered and prescription drug management:  I ordered medication including: Tylenol 1 g for  pain Percocet for pain  Reevaluation of the patient after these medicines showed that the patient improved I have reviewed the patients home medicines and have made adjustments as needed   Disposition:  After consideration of the diagnostic results, physical exam, history and the patients response to treatment feel that the patent would benefit from discharge with outpatient management.   Left rib fracture: 1 without evidence of infection.  Incentive spirometry was given and patient was educated on use.  Pain medication indicated.  Home supportive measures discussed.  Patient lives in [place] and  will follow-up with her PCP as needed care.  Return precautions were discussed.  Patient is understanding amenable to plan.  Discharged home in good condition.   Final Clinical Impression(s) / ED Diagnoses Final diagnoses:  Closed fracture of one rib of left side, initial encounter    Rx / DC Orders ED Discharge Orders          Ordered    oxyCODONE-acetaminophen (PERCOCET/ROXICET) 5-325 MG tablet  Every 6 hours PRN        10/25/21 1716              Tonye Pearson, PA-C 10/25/21 1748    Lajean Saver, MD 10/25/21 [phone]

## 2022-08-30 IMAGING — CR DG RIBS W/ CHEST 3+V*L*
5 series · 5 of 5 positions shown · non-contrast
Comparison: Chest radiograph on 10/23/2018

CLINICAL DATA: Acute onset left rib pain after picking up object
earlier today.

EXAM:
LEFT RIBS AND CHEST - 3+ VIEW

[w chest pa (1 of 2)]
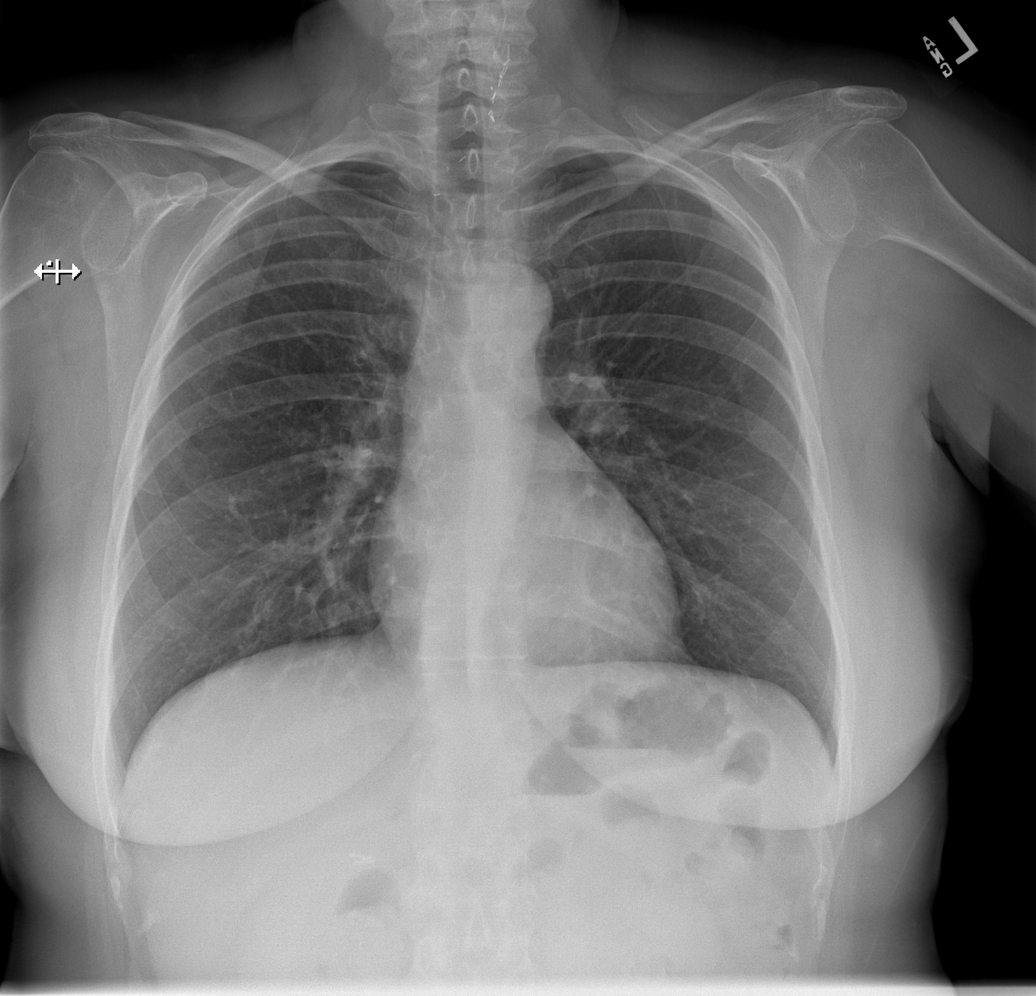

[w chest pa (2 of 2)]
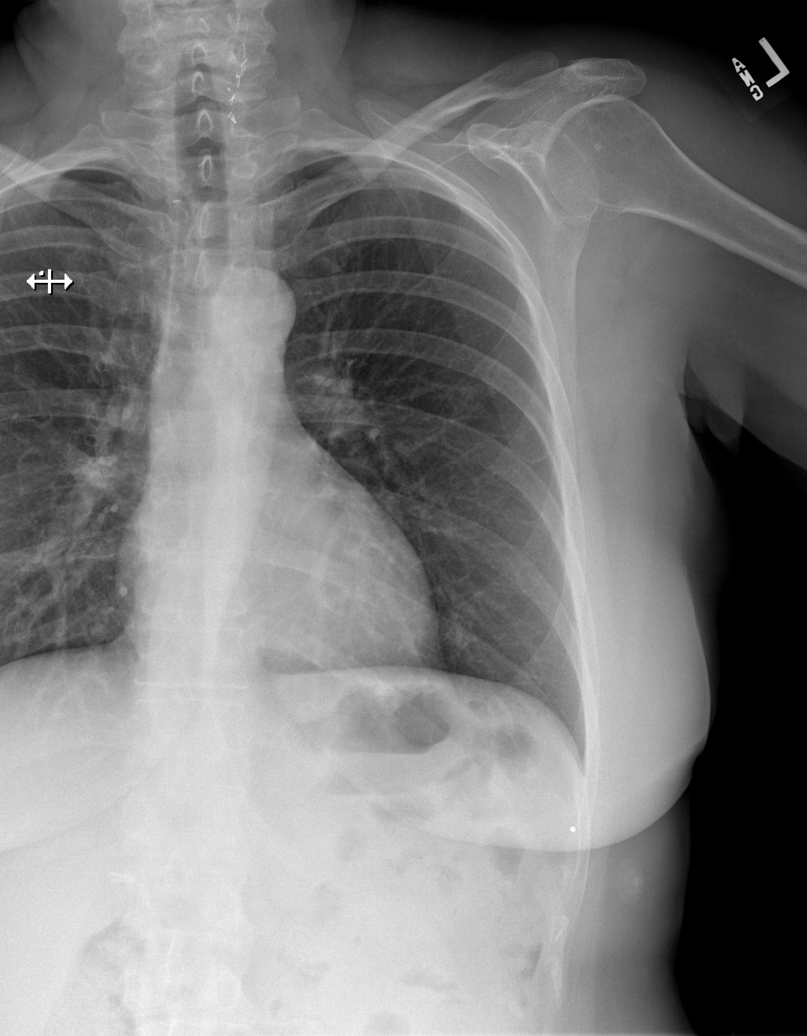

[w ribs ap lower left]
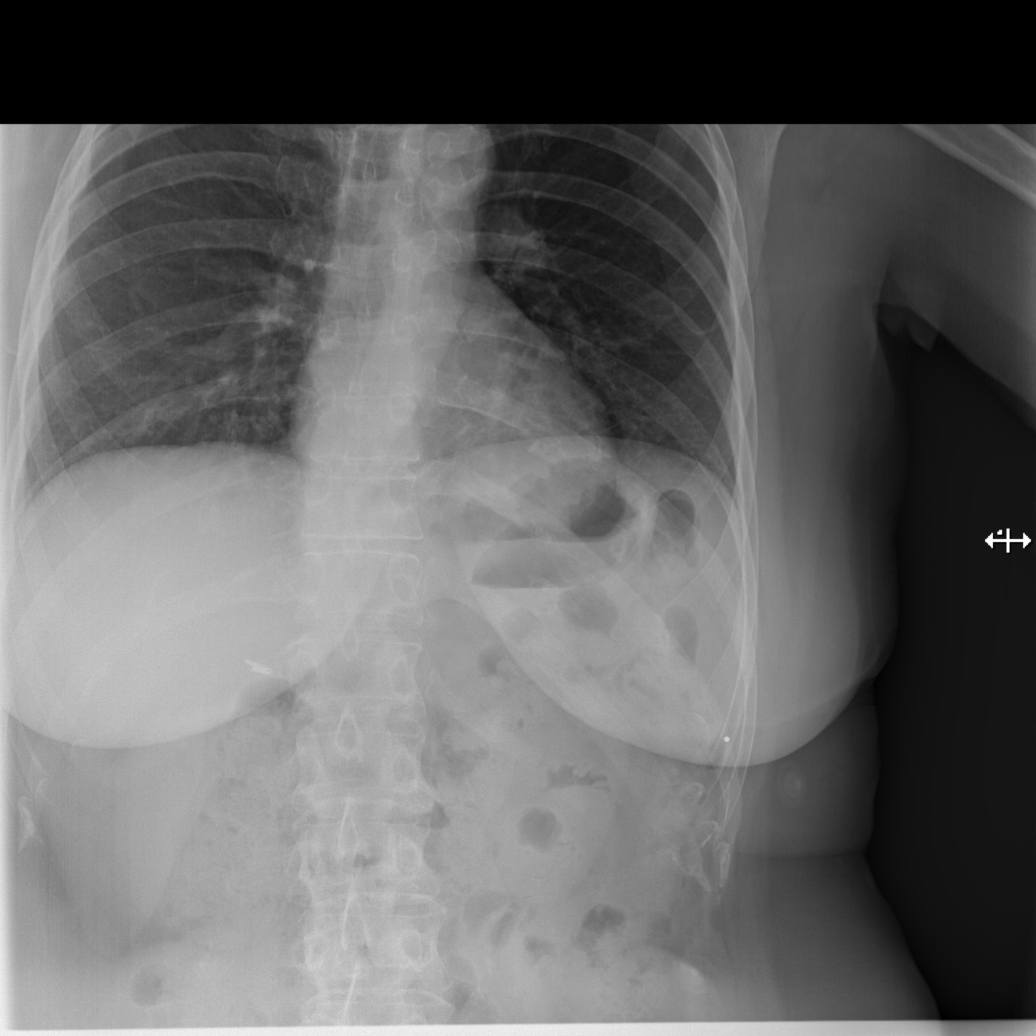

[w ribs obl left (1 of 2)]
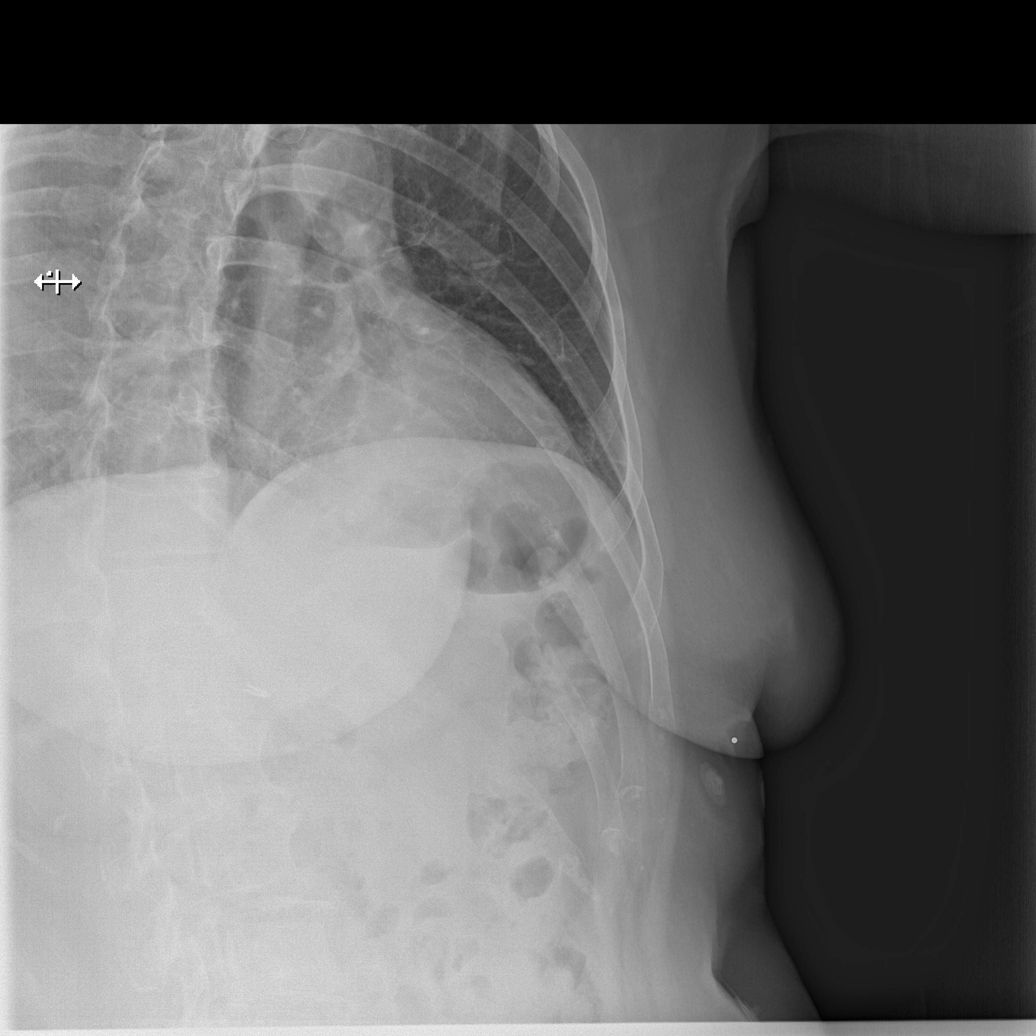

[w ribs obl left (2 of 2)]
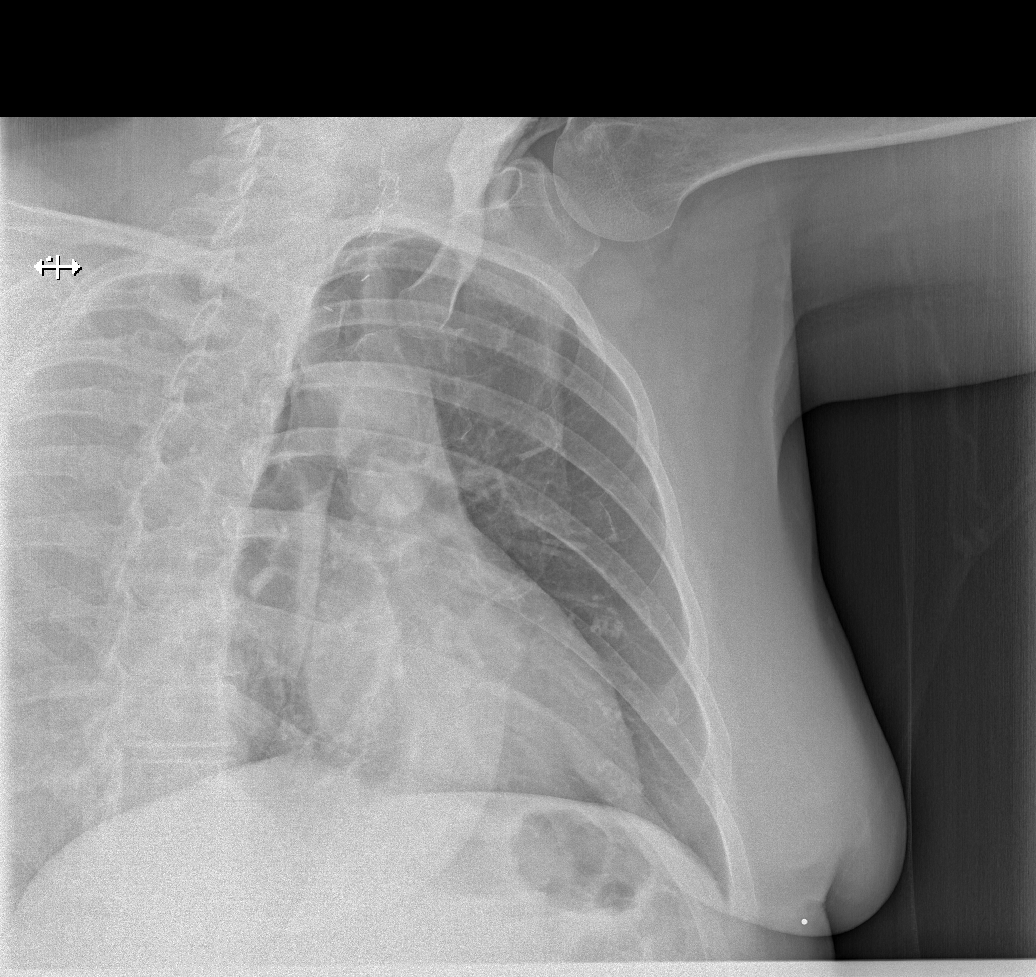

[5 of 5 positions shown; findings below may reference images not displayed]

FINDINGS: A nondisplaced fracture is seen involving the left anterior 8th rib.
There is no evidence of pneumothorax or pleural effusion.

Both lungs are clear. Heart size and mediastinal contours are within
normal limits. Surgical clips again seen in left lower neck.
IMPRESSION: Nondisplaced fracture of left anterior 8th rib.

No active cardiopulmonary disease.

## 2024-03-15 ENCOUNTER — Ambulatory Visit: Admitting: Physician Assistant

## 2024-07-03 ENCOUNTER — Encounter: Payer: Self-pay | Admitting: Physician Assistant

## 2024-07-03 ENCOUNTER — Ambulatory Visit: Admitting: Physician Assistant

## 2024-07-03 VITALS — BP 146/91 | HR 84

## 2024-07-03 DIAGNOSIS — L570 Actinic keratosis: Secondary | ICD-10-CM | POA: Diagnosis not present

## 2024-07-03 DIAGNOSIS — Z86006 Personal history of melanoma in-situ: Secondary | ICD-10-CM

## 2024-07-03 DIAGNOSIS — L821 Other seborrheic keratosis: Secondary | ICD-10-CM | POA: Diagnosis not present

## 2024-07-03 DIAGNOSIS — D489 Neoplasm of uncertain behavior, unspecified: Secondary | ICD-10-CM

## 2024-07-03 DIAGNOSIS — Z8589 Personal history of malignant neoplasm of other organs and systems: Secondary | ICD-10-CM | POA: Insufficient documentation

## 2024-07-03 DIAGNOSIS — D229 Melanocytic nevi, unspecified: Secondary | ICD-10-CM

## 2024-07-03 DIAGNOSIS — D239 Other benign neoplasm of skin, unspecified: Secondary | ICD-10-CM

## 2024-07-03 DIAGNOSIS — L814 Other melanin hyperpigmentation: Secondary | ICD-10-CM | POA: Diagnosis not present

## 2024-07-03 DIAGNOSIS — L578 Other skin changes due to chronic exposure to nonionizing radiation: Secondary | ICD-10-CM

## 2024-07-03 DIAGNOSIS — Z1283 Encounter for screening for malignant neoplasm of skin: Secondary | ICD-10-CM

## 2024-07-03 DIAGNOSIS — D225 Melanocytic nevi of trunk: Secondary | ICD-10-CM

## 2024-07-03 DIAGNOSIS — Z85828 Personal history of other malignant neoplasm of skin: Secondary | ICD-10-CM

## 2024-07-03 DIAGNOSIS — W908XXA Exposure to other nonionizing radiation, initial encounter: Secondary | ICD-10-CM

## 2024-07-03 DIAGNOSIS — Z85820 Personal history of malignant melanoma of skin: Secondary | ICD-10-CM

## 2024-07-03 DIAGNOSIS — L820 Inflamed seborrheic keratosis: Secondary | ICD-10-CM | POA: Diagnosis not present

## 2024-07-03 DIAGNOSIS — D1801 Hemangioma of skin and subcutaneous tissue: Secondary | ICD-10-CM

## 2024-07-03 HISTORY — DX: Other benign neoplasm of skin, unspecified: D23.9

## 2024-07-03 NOTE — Patient Instructions (Addendum)

## 2024-07-03 NOTE — Progress Notes (Signed)
 New Patient Visit   Subjective  Caitlyn [name] is a 62 y.o. female NEW PATIENT who presents for the following:  Total Body Skin Exam (TBSE)  Patient present today for new patient visit for TBSE.The patient reports she has spots, moles and lesions to be evaluated, some may be new or changing and the patient may have concern these could be cancer on her arms and chest. Patient has previously been treated by dermatology in [place], [place] [facility]). Patient reports she has hx of bx. Patient admits to family history/self history of skin cancers. Patient reports throughout her lifetime has had minimal sun exposure. Currently, patient reports if she has excessive sun exposure, she does apply sunscreen and/or wears protective coverings.  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 Prior to that - melanoma left proximal forearm ~ 2018 (unknown what stage). All prior pathology records are now in patient's chart from [facility].   The following portions of the chart were reviewed this encounter and updated as appropriate: medications, allergies, medical history  Review of Systems:  No other skin or systemic complaints except as noted in HPI or Assessment and Plan.  Objective  Well appearing patient in no apparent distress; mood and affect are within normal limits.  A full examination was performed including scalp, head, eyes, ears, nose, lips, neck, chest, axillae, abdomen, back, buttocks, bilateral upper extremities, bilateral lower extremities, hands, feet, fingers, toes, fingernails, and toenails. All findings within normal limits unless otherwise noted below.     Relevant exam findings are noted in the Assessment and Plan.  LEFT DELTOID 0.6 hyperkeratotic plaque    RIGHT CALF 1 cm erythematous scaly plaque  Left Upper Back 0.6 irregular brown macule   Left Lower Back 0.6 irregular brown macule     Assessment & Plan   LENTIGINES, SEBORRHEIC KERATOSES, HEMANGIOMAS - Benign normal skin lesions - Benign-appearing - Call for any changes  MELANOCYTIC NEVI - Tan-brown and/or pink-flesh-colored symmetric macules and papules - Benign appearing on exam today - Observation - Call clinic for new or changing moles - Recommend daily use of broad spectrum spf 30+ sunscreen to sun-exposed areas.   ACTINIC DAMAGE - Chronic condition, secondary to cumulative UV/sun exposure - diffuse scaly erythematous macules with underlying dyspigmentation - Recommend daily broad spectrum sunscreen SPF 30+ to sun-exposed areas, reapply every 2 hours as needed.  - Staying in the shade or wearing long sleeves, sun glasses (UVA+UVB protection) and wide brim hats (4-inch brim around the entire circumference of the hat) are also recommended for sun protection.  - Call for new or changing lesions.  SKIN CANCER SCREENING PERFORMED TODAY  HISTORY OF MELANOMA IN SITU x 2 - No evidence of recurrence today - No lymphadenopathy - Recommend regular full body skin exams - Recommend daily broad spectrum sunscreen SPF 30+ to sun-exposed areas, reapply every 2 hours as needed.  - Call if any new or changing lesions are noted between office visits   HISTORY OF SQUAMOUS CELL CARCINOMA OF THE SKIN - No evidence of recurrence today - No lymphadenopathy - Recommend regular full body skin exams - Recommend daily broad spectrum sunscreen SPF 30+ to sun-exposed areas, reapply every 2 hours as needed.  - Call if any new or changing lesions are noted between office visits   HISTORY OF BASAL CELL CARCINOMA OF THE SKIN - No evidence of recurrence today - Recommend regular full body skin exams - Recommend  daily broad spectrum sunscreen SPF 30+ to sun-exposed areas, reapply every 2 hours as needed.  - Call if any new or changing lesions are noted between office visits  PERSONAL HISTORY OF MALIGNANT MELANOMA OF  SKIN   SCREENING EXAM FOR SKIN CANCER   NEOPLASM OF UNCERTAIN BEHAVIOR (4) LEFT DELTOID Skin / nail biopsy Type of biopsy: tangential   Informed consent: discussed and consent obtained   Timeout: patient name, date of birth, surgical site, and procedure verified   Procedure prep:  Patient was prepped and draped in usual sterile fashion Prep type:  Isopropyl alcohol Anesthesia: the lesion was anesthetized in a standard fashion   Anesthetic:  1% lidocaine  w/ epinephrine 1-100,000 buffered w/ 8.4% NaHCO3 Instrument used: flexible razor blade   Hemostasis achieved with: pressure, aluminum chloride and electrodesiccation   Outcome: patient tolerated procedure well   Post-procedure details: sterile dressing applied and wound care instructions given   Dressing type: bandage and petrolatum    Specimen A - Surgical pathology Differential Diagnosis: 0.6 cm DN VS SCC  Check Margins: No RIGHT CALF Skin / nail biopsy Type of biopsy: tangential   Informed consent: discussed and consent obtained   Timeout: patient name, date of birth, surgical site, and procedure verified   Procedure prep:  Patient was prepped and draped in usual sterile fashion Prep type:  Isopropyl alcohol Anesthesia: the lesion was anesthetized in a standard fashion   Anesthetic:  1% lidocaine  w/ epinephrine 1-100,000 buffered w/ 8.4% NaHCO3 Instrument used: flexible razor blade   Hemostasis achieved with: pressure, aluminum chloride and electrodesiccation   Outcome: patient tolerated procedure well   Post-procedure details: sterile dressing applied and wound care instructions given   Dressing type: bandage and petrolatum    Specimen B - Surgical pathology Differential Diagnosis: 1 cm DN VS SCC  Check Margins: No Left Upper Back Epidermal / dermal shaving  Lesion diameter (cm):  0.6 Informed consent: discussed and consent obtained   Timeout: patient name, date of birth, surgical site, and procedure verified    Procedure prep:  Patient was prepped and draped in usual sterile fashion Prep type:  Isopropyl alcohol Anesthesia: the lesion was anesthetized in a standard fashion   Anesthetic:  1% lidocaine  w/ epinephrine 1-100,000 buffered w/ 8.4% NaHCO3 Instrument used: flexible razor blade   Hemostasis achieved with: pressure, aluminum chloride and electrodesiccation   Outcome: patient tolerated procedure well   Post-procedure details: sterile dressing applied and wound care instructions given   Dressing type: bandage and petrolatum    Specimen C - Surgical pathology Differential Diagnosis: 0.6 DN VS MM  Check Margins: No Left Lower Back Epidermal / dermal shaving  Lesion diameter (cm):  0.6 Informed consent: discussed and consent obtained   Timeout: patient name, date of birth, surgical site, and procedure verified   Procedure prep:  Patient was prepped and draped in usual sterile fashion Prep type:  Isopropyl alcohol Anesthesia: the lesion was anesthetized in a standard fashion   Anesthetic:  1% lidocaine  w/ epinephrine 1-100,000 buffered w/ 8.4% NaHCO3 Instrument used: flexible razor blade   Hemostasis achieved with: pressure, aluminum chloride and electrodesiccation   Outcome: patient tolerated procedure well   Post-procedure details: sterile dressing applied and wound care instructions given   Dressing type: bandage and petrolatum    Specimen D - Surgical pathology Differential Diagnosis: 0.6 DN VS MM  Check Margins: No INFLAMED SEBORRHEIC KERATOSIS (3) Left Temple, Right Forehead, Right Temple Destruction of lesion - Left Temple, Right Forehead, Right  Temple Complexity: simple   Destruction method: cryotherapy   Informed consent: discussed and consent obtained   Timeout:  patient name, date of birth, surgical site, and procedure verified Lesion destroyed using liquid nitrogen: Yes   Region frozen until ice ball extended beyond lesion: Yes   Outcome: patient tolerated procedure  well with no complications   Post-procedure details: wound care instructions given    HISTORY OF BASAL CELL CANCER   HISTORY OF SQUAMOUS CELL CARCINOMA   MULTIPLE BENIGN NEVI   LENTIGINES   CHERRY ANGIOMA   SEBORRHEIC KERATOSIS   ACTINIC SKIN DAMAGE    Return in about 1 year (around 07/03/2025) for TBSE FOLLOW UP.  I, Doyce Pan, CMA, am acting as scribe for Mili Piltz K, PA-C.  Documentation: I have reviewed the above documentation for accuracy and completeness, and I agree with the above.  Jud Fanguy K, PA-C

## 2024-07-05 ENCOUNTER — Ambulatory Visit: Payer: Self-pay | Admitting: Physician Assistant

## 2024-07-05 LAB — SURGICAL PATHOLOGY

## 2024-09-14 ENCOUNTER — Ambulatory Visit: Admitting: Dermatology

## 2024-09-14 ENCOUNTER — Encounter: Payer: Self-pay | Admitting: Dermatology

## 2024-09-14 VITALS — BP 149/79 | HR 87 | Temp 97.8°F

## 2024-09-14 DIAGNOSIS — L570 Actinic keratosis: Secondary | ICD-10-CM

## 2024-09-14 DIAGNOSIS — D225 Melanocytic nevi of trunk: Secondary | ICD-10-CM

## 2024-09-14 DIAGNOSIS — D239 Other benign neoplasm of skin, unspecified: Secondary | ICD-10-CM

## 2024-09-14 NOTE — Progress Notes (Signed)
 Follow-Up Visit   Subjective  Caitlyn [name] is a 63 y.o. 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 Biopsy was performed on 07/03/2024 by Erminio Like, PA-C.  Pt reports seen by PA previously, denies pain or itchiness.  The following portions of the chart were reviewed this encounter and updated as appropriate: medications, allergies, medical history  Review of Systems:  No other skin or systemic complaints except as noted in HPI or Assessment and Plan.   Objective  Well appearing patient in no apparent distress; mood and affect are within normal limits.  A focused examination was performed of the following areas: Left upper back Relevant physical exam findings are noted in the Assessment and Plan.   Left Upper Back Biopsy scar   Assessment & Plan   DYSPLASTIC NEVUS Left Upper Back - Skin excision - Left Upper Back  Excision method:  elliptical Lesion length (cm):  1.1 Lesion width (cm):  1 Margin per side (cm):  0.5 Total excision diameter (cm):  2.1 Informed consent: discussed and consent obtained   Timeout: patient name, date of birth, surgical site, and procedure verified   Procedure prep:  Patient was prepped and draped in usual sterile fashion Prep type:  Chlorhexidine Anesthesia: the lesion was anesthetized in a standard fashion   Anesthetic:  1% lidocaine  w/ epinephrine 1-100,000 buffered w/ 8.4% NaHCO3 Instrument used: #15 blade   Hemostasis achieved with: suture, pressure and electrodesiccation   Outcome: patient tolerated procedure well with no complications   Post-procedure details: sterile dressing applied and wound care instructions given   Dressing type: pressure dressing (Steri Strips)    - Skin repair - Left Upper Back Complexity:  Complex Final length (cm):  5 Informed consent: discussed and consent obtained   Timeout: patient name, date of birth,  surgical site, and procedure verified   Procedure prep:  Patient was prepped and draped in usual sterile fashion Prep type:  Chlorhexidine Anesthesia: the lesion was anesthetized in a standard fashion   Anesthetic:  1% lidocaine  w/ epinephrine 1-100,000 buffered w/ 8.4% NaHCO3 (25cc) Reason for type of repair: reduce the risk of dehiscence, infection, and necrosis, preserve normal anatomical and functional relationships and avoid adjacent structures   Undermining: area extensively undermined   Subcutaneous layers (deep stitches):  Suture size:  3-0 Suture type: PDS (polydioxanone)   Stitches:  Buried vertical mattress Fine/surface layer approximation (top stitches):  Suture type: cyanoacrylate tissue glue   Suture type comment:  Steri Strips Hemostasis achieved with: suture, pressure and electrodesiccation Outcome: patient tolerated procedure well with no complications   Post-procedure details: sterile dressing applied and wound care instructions given   Dressing type: pressure dressing and bandage (Steri Strips)    Specimen 1 - Surgical pathology Differential Diagnosis: DN [id number] Check Margins: No AK (ACTINIC KERATOSIS) Right Lower Leg - Posterior - Destruction of lesion - Right Lower Leg - Posterior Complexity: simple   Destruction method: cryotherapy   Informed consent: discussed and consent obtained   Timeout:  patient name, date of birth, surgical site, and procedure verified Lesion destroyed using liquid nitrogen: Yes   Region frozen until ice ball extended beyond lesion: Yes   Outcome: patient tolerated procedure well with no complications   Post-procedure details: wound care instructions given     The surgical wound was then cleaned, prepped, and re-anesthetized as above. Wound edges were undermined extensively along at least one entire edge and at a  distance equal to or greater than the width of the defect (see wound defect size above) in order to achieve closure  and decrease wound tension and anatomic distortion. Redundant tissue repair including standing cone removal was performed. Hemostasis was achieved with electrocautery. Subcutaneous and epidermal tissues were approximated with the above sutures. The surgical site was then lightly scrubbed with sterile, saline-soaked gauze. Steri-strips were applied, and the area was then bandaged using Vaseline ointment, non-adherent gauze, gauze pads, and tape to provide an adequate pressure dressing. The patient tolerated the procedure well, was given detailed written and verbal wound care instructions, and was discharged in good condition.   The patient will follow-up: PRN.  Return if symptoms worsen or fail to improve.  LILLETTE Rollene Gobble, RN, am acting as scribe for RUFUS CHRISTELLA HOLY, MD .   Documentation: I have reviewed the above documentation for accuracy and completeness, and I agree with the above.  RUFUS CHRISTELLA HOLY, MD

## 2024-09-14 NOTE — Patient Instructions (Signed)

## 2024-09-18 LAB — SURGICAL PATHOLOGY

## 2024-09-19 ENCOUNTER — Ambulatory Visit: Payer: Self-pay | Admitting: Dermatology

## 2025-07-04 ENCOUNTER — Ambulatory Visit: Admitting: Physician Assistant
